# Patient Record
Sex: Female | Born: 2005
Health system: Southern US, Community
[De-identification: ages and names within clinical notes are randomized; demographics above are authoritative.]

## PROBLEM LIST (undated history)

## (undated) DIAGNOSIS — F32A Depression, unspecified: Secondary | ICD-10-CM

## (undated) DIAGNOSIS — F909 Attention-deficit hyperactivity disorder, unspecified type: Secondary | ICD-10-CM

## (undated) DIAGNOSIS — F419 Anxiety disorder, unspecified: Secondary | ICD-10-CM

---
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
## 2023-01-14 ENCOUNTER — Ambulatory Visit (HOSPITAL_BASED_OUTPATIENT_CLINIC_OR_DEPARTMENT_OTHER): Payer: No Typology Code available for payment source | Admitting: Family Medicine

## 2023-02-10 ENCOUNTER — Telehealth: Payer: No Typology Code available for payment source | Admitting: Adult Health

## 2023-04-03 ENCOUNTER — Ambulatory Visit (HOSPITAL_BASED_OUTPATIENT_CLINIC_OR_DEPARTMENT_OTHER): Payer: No Typology Code available for payment source | Admitting: Family Medicine

## 2023-05-21 ENCOUNTER — Ambulatory Visit (HOSPITAL_BASED_OUTPATIENT_CLINIC_OR_DEPARTMENT_OTHER): Payer: No Typology Code available for payment source | Admitting: Family Medicine

## 2023-05-23 ENCOUNTER — Telehealth: Admitting: Emergency Medicine

## 2023-05-23 DIAGNOSIS — J029 Acute pharyngitis, unspecified: Secondary | ICD-10-CM | POA: Diagnosis not present

## 2023-05-23 NOTE — Progress Notes (Signed)
 E-Visit for Sore Throat  We are sorry that you are not feeling well.  Here is how we plan to help!  Your symptoms indicate a likely viral infection (Pharyngitis).   Pharyngitis is inflammation in the back of the throat which can cause a sore throat, scratchiness and sometimes difficulty swallowing.   Pharyngitis is typically caused by a respiratory virus and will just run its course.  Please keep in mind that your symptoms could last up to 10 days.  For throat pain, we recommend over the counter oral pain relief medications such as acetaminophen or aspirin, or anti-inflammatory medications such as ibuprofen or naproxen sodium.  Topical treatments such as oral throat lozenges or sprays may be used as needed.  Avoid close contact with loved ones, especially the very young and elderly.  Remember to wash your hands thoroughly throughout the day as this is the number one way to prevent the spread of infection and wipe down door knobs and counters with disinfectant.  After careful review of your answers, I would not recommend an antibiotic for your condition.  Antibiotics should not be used to treat conditions that we suspect are caused by viruses like the virus that causes the common cold or flu. However, some people can have Strep with atypical symptoms. You may need formal testing in clinic or office to confirm if your symptoms continue or worsen.  Providers prescribe antibiotics to treat infections caused by bacteria. Antibiotics are very powerful in treating bacterial infections when they are used properly.  To maintain their effectiveness, they should be used only when necessary.  Overuse of antibiotics has resulted in the development of super bugs that are resistant to treatment!    Home Care: Only take medications as instructed by your medical team. Do not drink alcohol while taking these medications. A steam or ultrasonic humidifier can help congestion.  You can place a towel over your head and  breathe in the steam from hot water coming from a faucet. Avoid close contacts especially the very young and the elderly. Cover your mouth when you cough or sneeze. Always remember to wash your hands.  Get Help Right Away If: You develop worsening fever or throat pain. You develop a severe head ache or visual changes. Your symptoms persist after you have completed your treatment plan.  Make sure you Understand these instructions. Will watch your condition. Will get help right away if you are not doing well or get worse.   Thank you for choosing an e-visit.  Your e-visit answers were reviewed by a board certified advanced clinical practitioner to complete your personal care plan. Depending upon the condition, your plan could have included both over the counter or prescription medications.  Please review your pharmacy choice. Make sure the pharmacy is open so you can pick up prescription now. If there is a problem, you may contact your provider through [employer] and have the prescription routed to another pharmacy.  Your safety is important to [place]. If you have drug allergies check your prescription carefully.   For the next 24 hours you can use MyChart to ask questions about today's visit, request a non-urgent call back, or ask for a work or school excuse. You will get an email in the next two days asking about your experience. I hope that your e-visit has been valuable and will speed your recovery.  I have spent 5 minutes in review of e-visit questionnaire, review and updating patient chart, medical decision making and response  to patient.   Margaretann Loveless, PA-C

## 2023-05-27 ENCOUNTER — Other Ambulatory Visit (HOSPITAL_BASED_OUTPATIENT_CLINIC_OR_DEPARTMENT_OTHER): Payer: Self-pay

## 2023-05-27 ENCOUNTER — Telehealth: Admitting: Family Medicine

## 2023-05-27 DIAGNOSIS — J069 Acute upper respiratory infection, unspecified: Secondary | ICD-10-CM

## 2023-05-27 MED ORDER — BENZONATATE 100 MG PO CAPS
100.0000 mg | ORAL_CAPSULE | Freq: Three times a day (TID) | ORAL | 0 refills | Status: AC | PRN
Start: 2023-05-27 — End: ?
  Filled 2023-05-27: qty 30, 10d supply, fill #0

## 2023-05-27 MED ORDER — PSEUDOEPH-BROMPHEN-DM 30-2-10 MG/5ML PO SYRP
5.0000 mL | ORAL_SOLUTION | Freq: Four times a day (QID) | ORAL | 0 refills | Status: AC | PRN
Start: 2023-05-27 — End: ?
  Filled 2023-05-27: qty 118, 6d supply, fill #0

## 2023-05-27 NOTE — Progress Notes (Signed)

## 2023-06-26 ENCOUNTER — Ambulatory Visit (HOSPITAL_COMMUNITY)
Admission: RE | Admit: 2023-06-26 | Discharge: 2023-06-26 | Disposition: A | Discharge status: Home / Self Care | Source: Ambulatory Visit | Attending: Physician Assistant | Admitting: Physician Assistant

## 2023-06-26 ENCOUNTER — Other Ambulatory Visit: Payer: Self-pay | Admitting: Physician Assistant

## 2023-06-26 DIAGNOSIS — H4713 Papilledema associated with retinal disorder: Secondary | ICD-10-CM | POA: Insufficient documentation

## 2023-06-26 MED ORDER — GADOBUTROL 1 MMOL/ML IV SOLN
8.0000 mL | Freq: Once | INTRAVENOUS | Status: AC | PRN
  Administered 2023-06-26: 8 mL via INTRAVENOUS

## 2023-07-03 ENCOUNTER — Ambulatory Visit (HOSPITAL_BASED_OUTPATIENT_CLINIC_OR_DEPARTMENT_OTHER): Admitting: Family Medicine

## 2023-07-15 ENCOUNTER — Ambulatory Visit: Payer: Self-pay

## 2023-07-15 NOTE — Telephone Encounter (Signed)
  Chief Complaint: Kitten bite on finger Symptoms: Pain redness Frequency: a few minutes ago Pertinent Negatives: Patient denies  Disposition: [] ED /[x] Urgent Care (no appt availability in office) / [] Appointment(In office/virtual)/ []  [facility]/ [] Home Care/ [] Refused Recommended Disposition /[] [facility]/ []  Follow-up with PCP Additional Notes: Pt reports that she was bitten by a kitten a few minutes ago. Rafael Bun is just a few weeks old. Kitten's mother was a stray with unknown vaccine record. Pt has washed out wound. Pt is unsure when last tetanus shot was. PT will go to UC for care.     Copied from CRM [id number]. Topic: Clinical - Red Word Triage >> Jul 15, 2023 11:52 AM Loreda Rodriguez T wrote: Red Word that prompted transfer to Nurse Triage: patient stated she was bitten by a cat. The bite broke the skin and the area is slightly tender. Reason for Disposition  [1] Puncture wound (hole through the skin) AND [2] from a cat bite (or deep claw puncture wound)  Answer Assessment - Initial Assessment Questions 1. ANIMAL: "What type of animal caused the bite?" "Is the injury from a bite or a claw?" If the animal is a dog or a cat, ask: "Was it a pet or a stray?" "Was it acting ill or behaving strangely?"     cat 2. LOCATION: "Where is the bite located?"      finger 3. SIZE: "How big is the bite?" "What does it look like?"      Puncture 4. ONSET: "When did the bite happen?" (Minutes or hours ago)      Just now. 5. CIRCUMSTANCES: "Tell me how this happened."      Trying to give kitten a treat 6. TETANUS: "When was your last tetanus booster?"     unsure 7. RABIES VACCINE: For dog or cat bites, ask: "Do you know if the pet is vaccinated against rabies?"  (e.g., yes, no, overdue for rabies shot, unknown)     Too young for vaccine -   8. PREGNANCY: "Is there any chance you are pregnant?" "When was your last menstrual period?"     no  Protocols used: Animal Bite-A-AH

## 2023-07-22 ENCOUNTER — Ambulatory Visit (HOSPITAL_BASED_OUTPATIENT_CLINIC_OR_DEPARTMENT_OTHER): Admitting: Family Medicine

## 2023-08-11 ENCOUNTER — Ambulatory Visit (HOSPITAL_BASED_OUTPATIENT_CLINIC_OR_DEPARTMENT_OTHER): Admitting: Family Medicine

## 2023-09-09 ENCOUNTER — Ambulatory Visit (HOSPITAL_BASED_OUTPATIENT_CLINIC_OR_DEPARTMENT_OTHER): Admitting: Family Medicine

## 2023-10-17 ENCOUNTER — Ambulatory Visit (HOSPITAL_BASED_OUTPATIENT_CLINIC_OR_DEPARTMENT_OTHER): Admitting: Family Medicine

## 2023-12-10 ENCOUNTER — Other Ambulatory Visit (HOSPITAL_BASED_OUTPATIENT_CLINIC_OR_DEPARTMENT_OTHER): Payer: Self-pay

## 2023-12-10 MED ORDER — FLUZONE 0.5 ML IM SUSY
0.5000 mL | PREFILLED_SYRINGE | Freq: Once | INTRAMUSCULAR | 0 refills | Status: AC
  Filled 2023-12-10: qty 0.5, 1d supply, fill #0

## 2023-12-19 ENCOUNTER — Ambulatory Visit (HOSPITAL_BASED_OUTPATIENT_CLINIC_OR_DEPARTMENT_OTHER): Admitting: Family Medicine

## 2023-12-20 ENCOUNTER — Other Ambulatory Visit (HOSPITAL_BASED_OUTPATIENT_CLINIC_OR_DEPARTMENT_OTHER): Payer: Self-pay

## 2024-03-08 ENCOUNTER — Ambulatory Visit (HOSPITAL_BASED_OUTPATIENT_CLINIC_OR_DEPARTMENT_OTHER): Admitting: Family Medicine

## 2024-04-19 ENCOUNTER — Ambulatory Visit (HOSPITAL_BASED_OUTPATIENT_CLINIC_OR_DEPARTMENT_OTHER): Admitting: Family Medicine
